# Patient Record
Sex: Female | Born: 1935 | Race: White | Hispanic: No | Marital: Married
Health system: Southern US, Community
[De-identification: ages and names within clinical notes are randomized; demographics above are authoritative.]

---

## 2006-11-19 ENCOUNTER — Ambulatory Visit: Payer: Self-pay | Admitting: Internal Medicine

## 2009-01-09 ENCOUNTER — Ambulatory Visit: Payer: Self-pay | Admitting: Internal Medicine

## 2010-08-10 ENCOUNTER — Ambulatory Visit: Payer: Self-pay | Admitting: Internal Medicine

## 2010-08-28 ENCOUNTER — Ambulatory Visit: Payer: Self-pay | Admitting: Gastroenterology

## 2010-09-05 ENCOUNTER — Ambulatory Visit: Payer: Self-pay | Admitting: Internal Medicine

## 2012-05-18 ENCOUNTER — Inpatient Hospital Stay: Payer: Self-pay | Admitting: Internal Medicine

## 2012-05-18 LAB — BASIC METABOLIC PANEL
Calcium, Total: 11.1 mg/dL — ABNORMAL HIGH (ref 8.5–10.1)
Co2: 43 mmol/L (ref 21–32)
EGFR (African American): 55 — ABNORMAL LOW
Potassium: 3.6 mmol/L (ref 3.5–5.1)
Sodium: 142 mmol/L (ref 136–145)

## 2012-05-18 LAB — COMPREHENSIVE METABOLIC PANEL
Alkaline Phosphatase: 176 U/L — ABNORMAL HIGH (ref 50–136)
Anion Gap: 1 — ABNORMAL LOW (ref 7–16)
BUN: 23 mg/dL — ABNORMAL HIGH (ref 7–18)
Bilirubin,Total: 0.7 mg/dL (ref 0.2–1.0)
Calcium, Total: 10.3 mg/dL — ABNORMAL HIGH (ref 8.5–10.1)
Chloride: 100 mmol/L (ref 98–107)
Co2: 39 mmol/L — ABNORMAL HIGH (ref 21–32)
Creatinine: 0.91 mg/dL (ref 0.60–1.30)
EGFR (Non-African Amer.): >60
Osmolality: 288 (ref 275–301)
SGOT(AST): 101 U/L — ABNORMAL HIGH (ref 15–37)
SGPT (ALT): 59 U/L (ref 12–78)
Sodium: 140 mmol/L (ref 136–145)
Total Protein: 6.9 g/dL (ref 6.4–8.2)

## 2012-05-18 LAB — POTASSIUM: Potassium: 6.1 mmol/L — ABNORMAL HIGH (ref 3.5–5.1)

## 2012-05-18 LAB — MAGNESIUM: Magnesium: 2.1 mg/dL

## 2012-05-18 LAB — CK TOTAL AND CKMB (NOT AT ARMC)
CK, Total: 112 U/L (ref 21–215)
CK, Total: 160 U/L (ref 21–215)
CK-MB: 4.7 ng/mL — ABNORMAL HIGH (ref 0.5–3.6)

## 2012-05-18 LAB — CBC
MCH: 30.2 pg (ref 26.0–34.0)
MCV: 94 fL (ref 80–100)
Platelet: 104 10*3/uL — ABNORMAL LOW (ref 150–440)
RBC: 4.68 10*6/uL (ref 3.80–5.20)

## 2012-05-18 LAB — DIGOXIN LEVEL: Digoxin: 1.3 ng/mL

## 2012-05-18 LAB — PRO B NATRIURETIC PEPTIDE: B-Type Natriuretic Peptide: 15404 pg/mL — ABNORMAL HIGH (ref 0–450)

## 2012-05-18 LAB — TROPONIN I: Troponin-I: 0.12 ng/mL — ABNORMAL HIGH

## 2012-05-19 LAB — COMPREHENSIVE METABOLIC PANEL
BUN: 26 mg/dL — ABNORMAL HIGH (ref 7–18)
Calcium, Total: 10 mg/dL (ref 8.5–10.1)
Chloride: 92 mmol/L — ABNORMAL LOW (ref 98–107)
EGFR (African American): 59 — ABNORMAL LOW
Glucose: 136 mg/dL — ABNORMAL HIGH (ref 65–99)
SGOT(AST): 40 U/L — ABNORMAL HIGH (ref 15–37)
SGPT (ALT): 37 U/L (ref 12–78)
Sodium: 142 mmol/L (ref 136–145)
Total Protein: 6.4 g/dL (ref 6.4–8.2)

## 2012-05-19 LAB — CBC WITH DIFFERENTIAL/PLATELET
Basophil #: 0 10*3/uL (ref 0.0–0.1)
Basophil %: 0.1 %
Eosinophil %: 0 %
HCT: 40.4 % (ref 35.0–47.0)
HGB: 12.9 g/dL (ref 12.0–16.0)
Lymphocyte #: 0.6 10*3/uL — ABNORMAL LOW (ref 1.0–3.6)
MCH: 29.5 pg (ref 26.0–34.0)
MCV: 92 fL (ref 80–100)
Neutrophil #: 8.1 10*3/uL — ABNORMAL HIGH (ref 1.4–6.5)
RBC: 4.39 10*6/uL (ref 3.80–5.20)
WBC: 8.8 10*3/uL (ref 3.6–11.0)

## 2012-05-19 LAB — MAGNESIUM: Magnesium: 1.9 mg/dL

## 2012-05-19 LAB — LIPID PANEL
Cholesterol: 136 mg/dL (ref 0–200)
Ldl Cholesterol, Calc: 59 mg/dL (ref 0–100)
Triglycerides: 81 mg/dL (ref 0–200)

## 2012-05-20 LAB — BASIC METABOLIC PANEL
Anion Gap: 9 (ref 7–16)
Anion Gap: 5 — ABNORMAL LOW (ref 7–16)
Calcium, Total: 9.9 mg/dL (ref 8.5–10.1)
Calcium, Total: 9.6 mg/dL (ref 8.5–10.1)
Co2: 39 mmol/L — ABNORMAL HIGH (ref 21–32)
Creatinine: 1.07 mg/dL (ref 0.60–1.30)
EGFR (African American): 59 — ABNORMAL LOW
EGFR (African American): >60
EGFR (Non-African Amer.): 51 — ABNORMAL LOW
EGFR (Non-African Amer.): 59 — ABNORMAL LOW
Glucose: 162 mg/dL — ABNORMAL HIGH (ref 65–99)
Osmolality: 292 (ref 275–301)
Osmolality: 285 (ref 275–301)
Sodium: 143 mmol/L (ref 136–145)

## 2012-05-21 LAB — BASIC METABOLIC PANEL WITH GFR
Anion Gap: 3 — ABNORMAL LOW
BUN: 23 mg/dL — ABNORMAL HIGH
Calcium, Total: 9.2 mg/dL
Chloride: 98 mmol/L
Co2: 42 mmol/L
Creatinine: 0.72 mg/dL
EGFR (African American): >60
EGFR (Non-African Amer.): >60
Glucose: 103 mg/dL — ABNORMAL HIGH
Osmolality: 289
Potassium: 3.8 mmol/L
Sodium: 143 mmol/L

## 2012-05-31 ENCOUNTER — Inpatient Hospital Stay: Payer: Self-pay | Admitting: Internal Medicine

## 2012-05-31 LAB — URINALYSIS, COMPLETE
Bilirubin,UR: NEGATIVE
Glucose,UR: NEGATIVE mg/dL (ref 0–75)
Hyaline Cast: 1
Ketone: NEGATIVE
Ph: 6 (ref 4.5–8.0)
RBC,UR: 2 /HPF (ref 0–5)
Specific Gravity: 1.012 (ref 1.003–1.030)
WBC UR: 4 /HPF (ref 0–5)

## 2012-05-31 LAB — CBC
HCT: 37.1 % (ref 35.0–47.0)
HGB: 12.3 g/dL (ref 12.0–16.0)
MCHC: 33 g/dL (ref 32.0–36.0)
Platelet: 234 10*3/uL (ref 150–440)
RDW: 14.1 % (ref 11.5–14.5)
WBC: 26.7 10*3/uL — ABNORMAL HIGH (ref 3.6–11.0)

## 2012-05-31 LAB — DIFFERENTIAL
Basophil #: 0.2 10*3/uL — ABNORMAL HIGH (ref 0.0–0.1)
Lymphocyte %: 5.3 %
Monocyte #: 0.7 x10 3/mm (ref 0.2–0.9)
Monocyte %: 2.6 %
Neutrophil #: 24.4 10*3/uL — ABNORMAL HIGH (ref 1.4–6.5)
Neutrophil %: 91.4 %

## 2012-05-31 LAB — BASIC METABOLIC PANEL
Anion Gap: 7 (ref 7–16)
BUN: 29 mg/dL — ABNORMAL HIGH (ref 7–18)
Chloride: 95 mmol/L — ABNORMAL LOW (ref 98–107)
Co2: 34 mmol/L — ABNORMAL HIGH (ref 21–32)
Creatinine: 0.9 mg/dL (ref 0.60–1.30)
Glucose: 124 mg/dL — ABNORMAL HIGH (ref 65–99)
Osmolality: 279 (ref 275–301)
Potassium: 4.8 mmol/L (ref 3.5–5.1)

## 2012-05-31 LAB — MAGNESIUM: Magnesium: 2.1 mg/dL

## 2012-05-31 LAB — HEPATIC FUNCTION PANEL A (ARMC)
Albumin: 3 g/dL — ABNORMAL LOW (ref 3.4–5.0)
Bilirubin, Direct: 0.1 mg/dL (ref 0.00–0.20)
Bilirubin,Total: 0.8 mg/dL (ref 0.2–1.0)
SGOT(AST): 36 U/L (ref 15–37)
SGPT (ALT): 30 U/L (ref 12–78)

## 2012-05-31 LAB — CK TOTAL AND CKMB (NOT AT ARMC)
CK, Total: 53 U/L (ref 21–215)
CK-MB: 4.9 ng/mL — ABNORMAL HIGH (ref 0.5–3.6)

## 2012-05-31 LAB — TROPONIN I
Troponin-I: 0.13 ng/mL — ABNORMAL HIGH
Troponin-I: 0.14 ng/mL — ABNORMAL HIGH

## 2012-05-31 LAB — LIPASE, BLOOD: Lipase: 158 U/L (ref 73–393)

## 2012-06-01 LAB — COMPREHENSIVE METABOLIC PANEL
Albumin: 2.3 g/dL — ABNORMAL LOW (ref 3.4–5.0)
Alkaline Phosphatase: 63 U/L (ref 50–136)
Anion Gap: 5 — ABNORMAL LOW (ref 7–16)
BUN: 21 mg/dL — ABNORMAL HIGH (ref 7–18)
Calcium, Total: 8.2 mg/dL — ABNORMAL LOW (ref 8.5–10.1)
Chloride: 99 mmol/L (ref 98–107)
Co2: 34 mmol/L — ABNORMAL HIGH (ref 21–32)
EGFR (African American): >60
EGFR (Non-African Amer.): >60
Osmolality: 278 (ref 275–301)
Potassium: 4.1 mmol/L (ref 3.5–5.1)
SGOT(AST): 23 U/L (ref 15–37)
SGPT (ALT): 21 U/L (ref 12–78)
Sodium: 138 mmol/L (ref 136–145)
Total Protein: 4.5 g/dL — ABNORMAL LOW (ref 6.4–8.2)

## 2012-06-01 LAB — CBC WITH DIFFERENTIAL/PLATELET
Basophil %: 0.2 %
Eosinophil %: 0.4 %
HCT: 27.5 % — ABNORMAL LOW (ref 35.0–47.0)
HGB: 9.2 g/dL — ABNORMAL LOW (ref 12.0–16.0)
Lymphocyte #: 1.7 10*3/uL (ref 1.0–3.6)
Lymphocyte %: 9 %
MCV: 91 fL (ref 80–100)
Monocyte %: 3.6 %
Neutrophil #: 15.9 10*3/uL — ABNORMAL HIGH (ref 1.4–6.5)
RBC: 3.03 10*6/uL — ABNORMAL LOW (ref 3.80–5.20)
WBC: 18.3 10*3/uL — ABNORMAL HIGH (ref 3.6–11.0)

## 2012-06-01 LAB — DIGOXIN LEVEL: Digoxin: 1.77 ng/mL

## 2012-06-01 LAB — URINE CULTURE

## 2012-06-01 LAB — CK TOTAL AND CKMB (NOT AT ARMC)
CK, Total: 41 U/L (ref 21–215)
CK-MB: 5.6 ng/mL — ABNORMAL HIGH (ref 0.5–3.6)

## 2012-06-01 LAB — TROPONIN I: Troponin-I: 0.16 ng/mL — ABNORMAL HIGH

## 2012-06-02 LAB — CBC WITH DIFFERENTIAL/PLATELET
Basophil #: 0 10*3/uL (ref 0.0–0.1)
Basophil #: 0 10*3/uL (ref 0.0–0.1)
Basophil %: 0.1 %
Basophil %: 0.2 %
Eosinophil #: 0 10*3/uL (ref 0.0–0.7)
HGB: 7.9 g/dL — ABNORMAL LOW (ref 12.0–16.0)
HGB: 8.6 g/dL — ABNORMAL LOW (ref 12.0–16.0)
Lymphocyte %: 6.9 %
Lymphocyte %: 5.2 %
MCH: 30.2 pg (ref 26.0–34.0)
MCH: 30.3 pg (ref 26.0–34.0)
MCHC: 33.3 g/dL (ref 32.0–36.0)
MCHC: 33.4 g/dL (ref 32.0–36.0)
MCV: 91 fL (ref 80–100)
Monocyte #: 0.6 x10 3/mm (ref 0.2–0.9)
Monocyte #: 0.6 x10 3/mm (ref 0.2–0.9)
Monocyte %: 3.4 %
Neutrophil #: 15.5 10*3/uL — ABNORMAL HIGH (ref 1.4–6.5)
Neutrophil %: 90.9 %
Platelet: 137 10*3/uL — ABNORMAL LOW (ref 150–440)
RBC: 2.62 10*6/uL — ABNORMAL LOW (ref 3.80–5.20)
RDW: 14.1 % (ref 11.5–14.5)
RDW: 13.6 % (ref 11.5–14.5)

## 2012-06-05 LAB — CBC WITH DIFFERENTIAL/PLATELET
Basophil #: 0 10*3/uL (ref 0.0–0.1)
Basophil %: 0.4 %
Eosinophil %: 1.3 %
HCT: 23 % — ABNORMAL LOW (ref 35.0–47.0)
HGB: 7.8 g/dL — ABNORMAL LOW (ref 12.0–16.0)
Lymphocyte %: 14 %
MCHC: 34 g/dL (ref 32.0–36.0)
MCV: 91 fL (ref 80–100)
Monocyte %: 5 %
Neutrophil #: 5.4 10*3/uL (ref 1.4–6.5)
RBC: 2.54 10*6/uL — ABNORMAL LOW (ref 3.80–5.20)
RDW: 14.1 % (ref 11.5–14.5)
WBC: 6.8 10*3/uL (ref 3.6–11.0)

## 2012-12-08 DEATH — deceased

## 2014-05-20 IMAGING — CR DG CHEST 1V PORT
1 series · 1 of 1 positions shown · non-contrast
Comparison: none

REASON FOR EXAM: SOB
COMMENTS:

[ap]
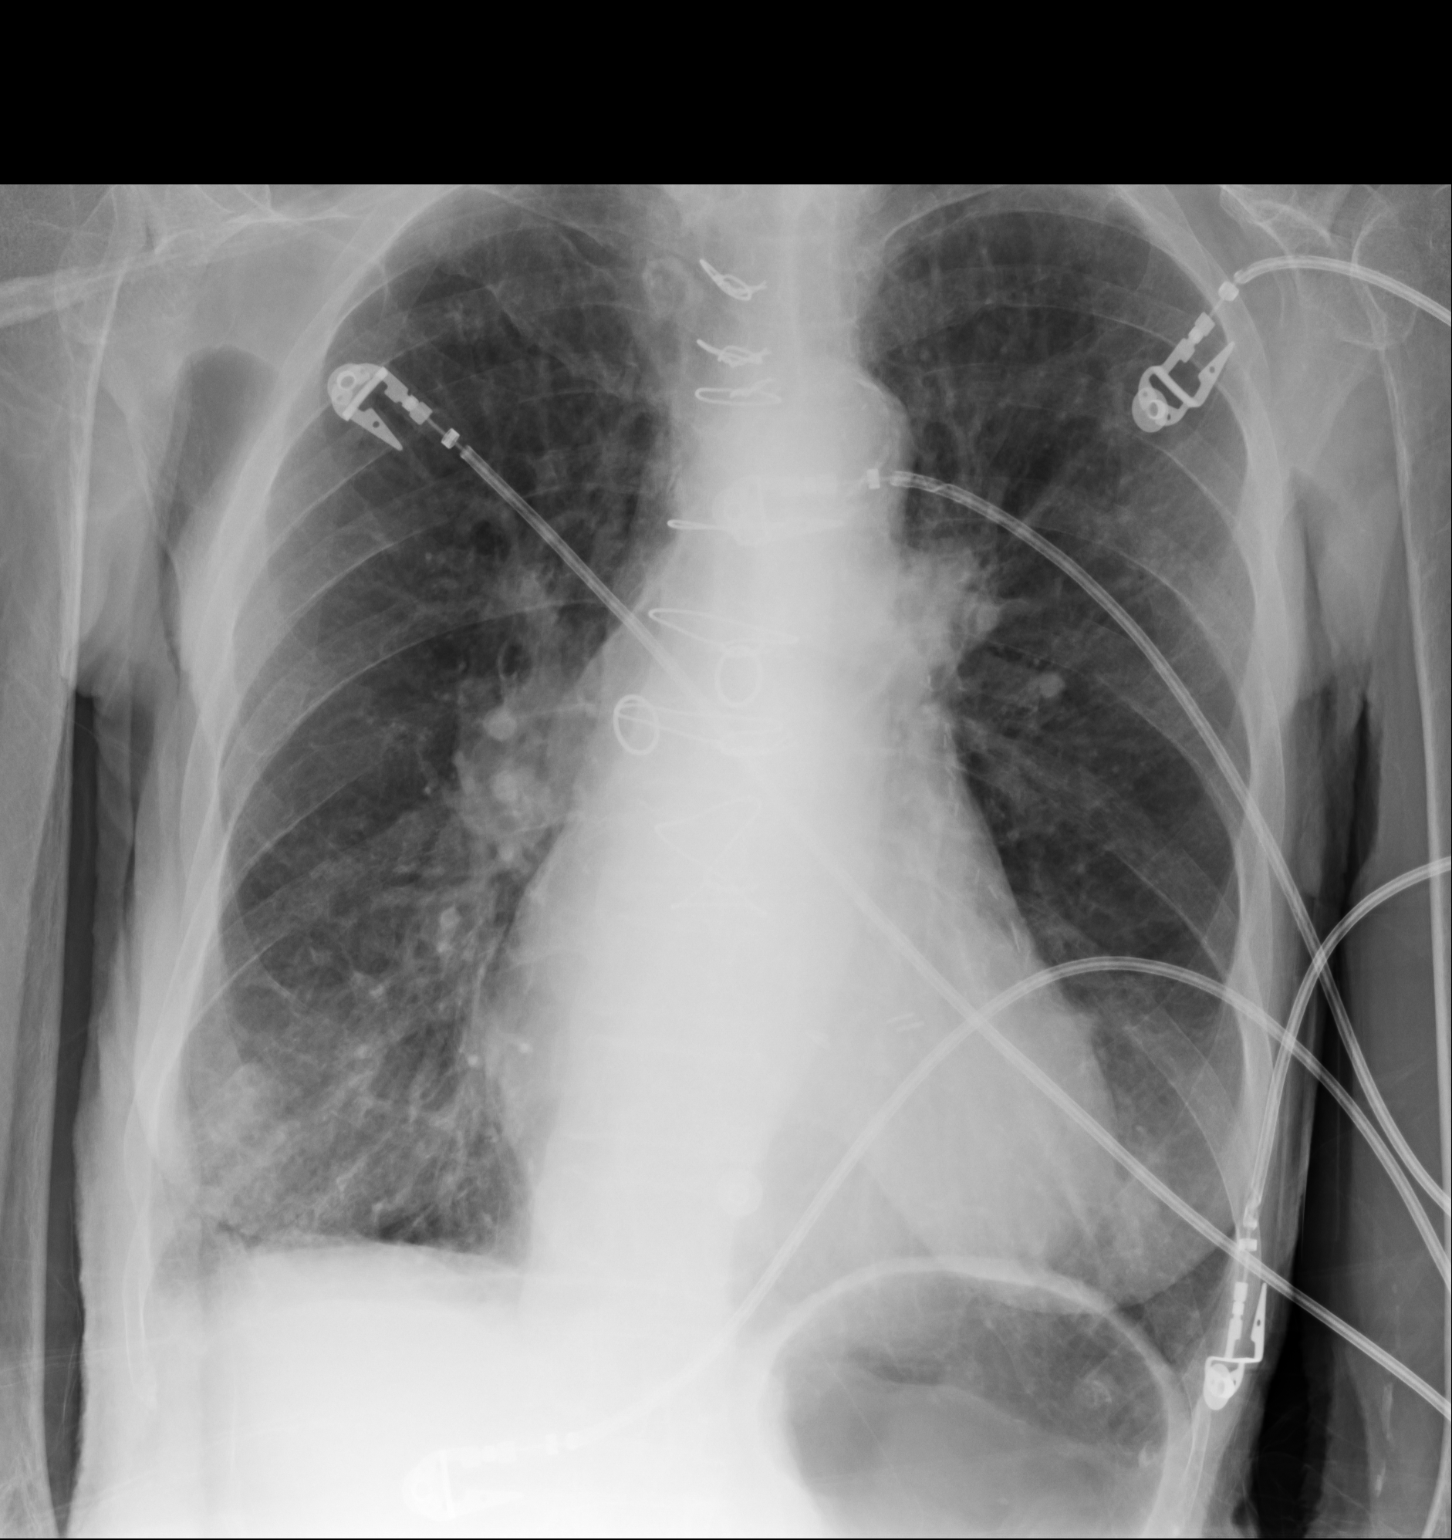

[1 of 1 positions shown; findings below may reference images not displayed]

PROCEDURE:     DXR - DXR PORTABLE CHEST SINGLE VIEW  - May 18, 2012  [DATE]

RESULT:     Comparison is made to the study August 10, 2010.

The lungs remain hyperinflated. There are coarse increased lung markings at
the right lung base which are new. Elsewhere the interstitial markings are
slightly increased. The cardiac silhouette is top normal in size. There is
mild prominence of the central pulmonary vascularity. The patient has
undergone previous CABG.
IMPRESSION: 1. The findings are consistent with COPD with superimposed atelectasis or
developing infiltrate in the right lower lobe.
2. Mildly increased interstitial markings are present diffusely which may
reflect an element of low-grade interstitial edema of cardiac or noncardiac
cause. A followup PA and lateral chest x-ray would be of value.

[REDACTED]

## 2014-06-03 IMAGING — CR DG CHEST 2V
1 series · 3 of 3 positions shown · non-contrast
Comparison: none

REASON FOR EXAM: COPD
COMMENTS:

PROCEDURE:     DXR - DXR CHEST PA (OR AP) AND LATERAL  - June 01, 2012  [DATE]
RESULT:     Comparison: 05/31/2012, 05/21/2012

[Series 3: x chest ap · 0.14mm/px · 3 of 3 slices shown]
[im 1/3]
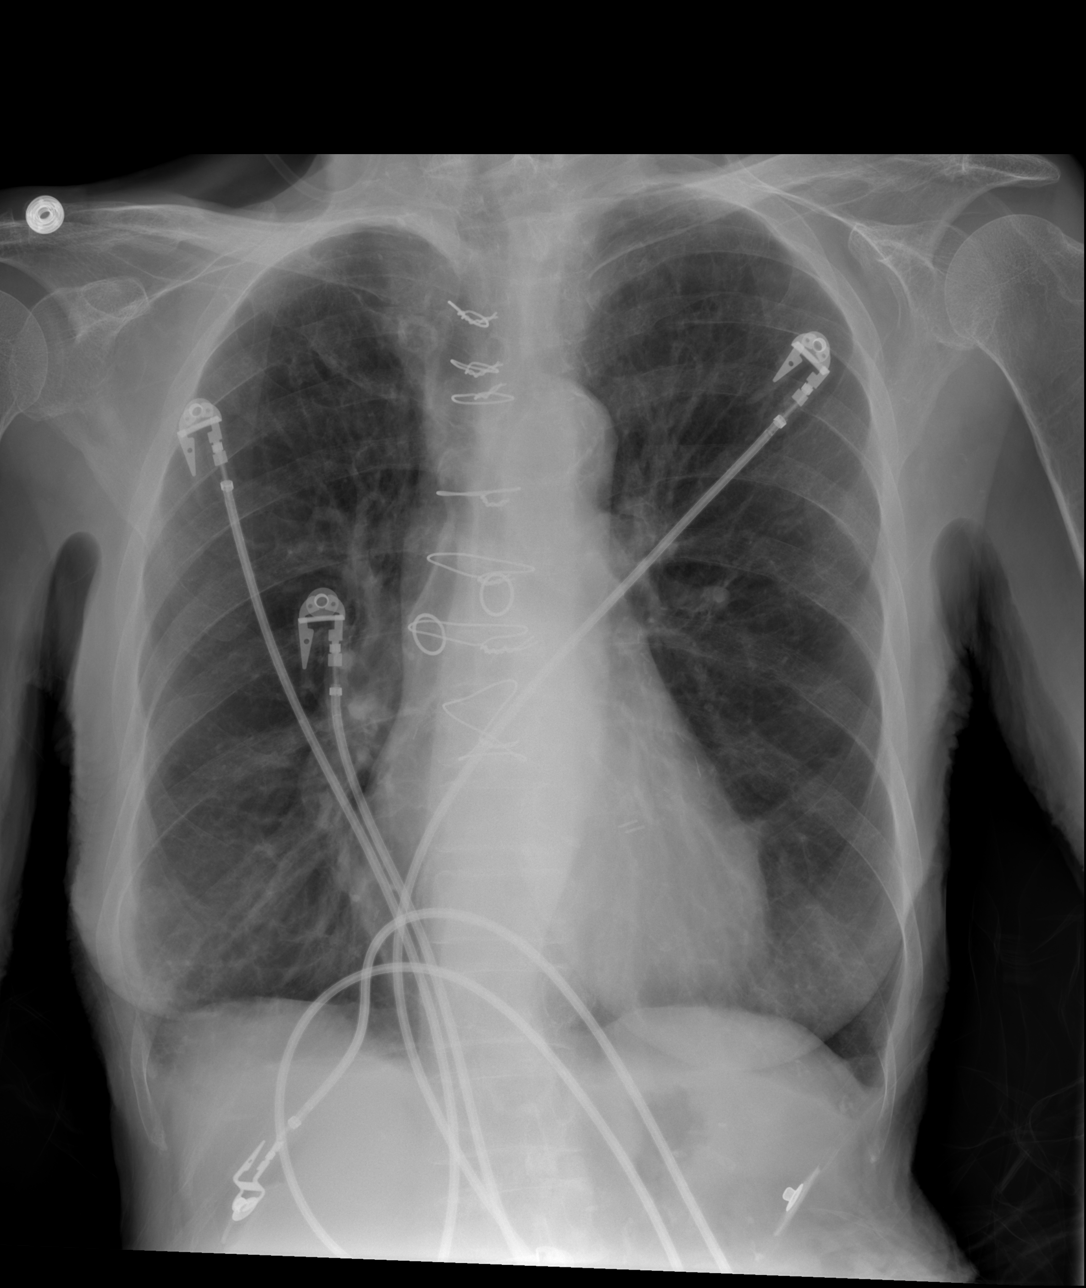
[im 2/3]
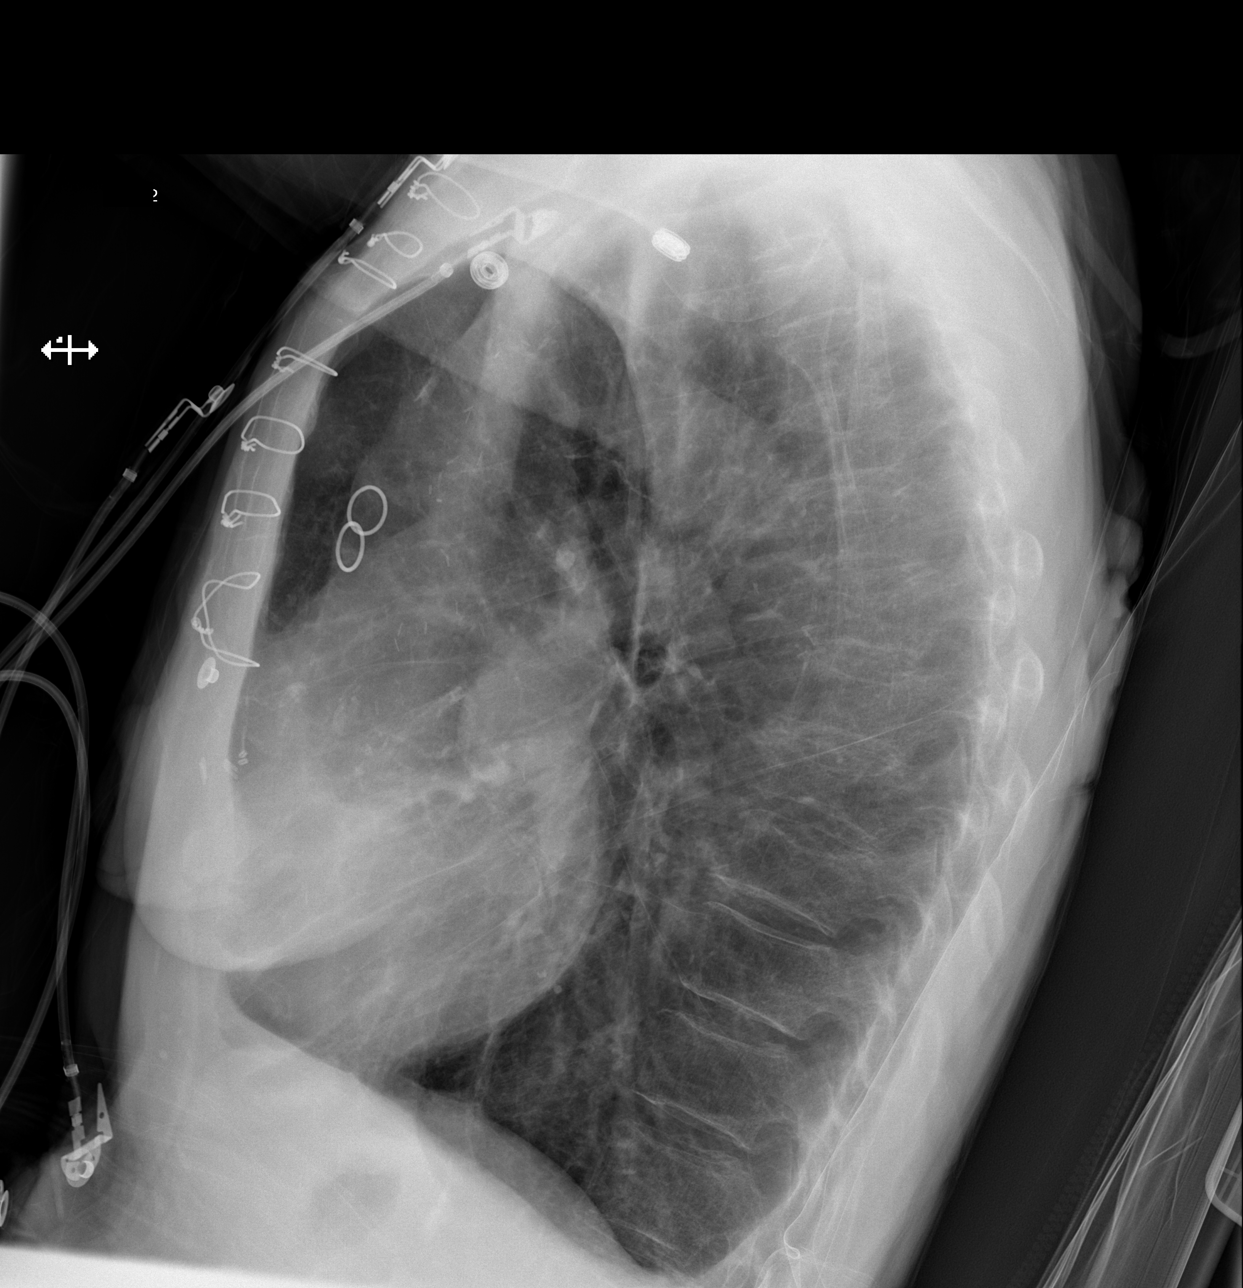
[im 3/3]
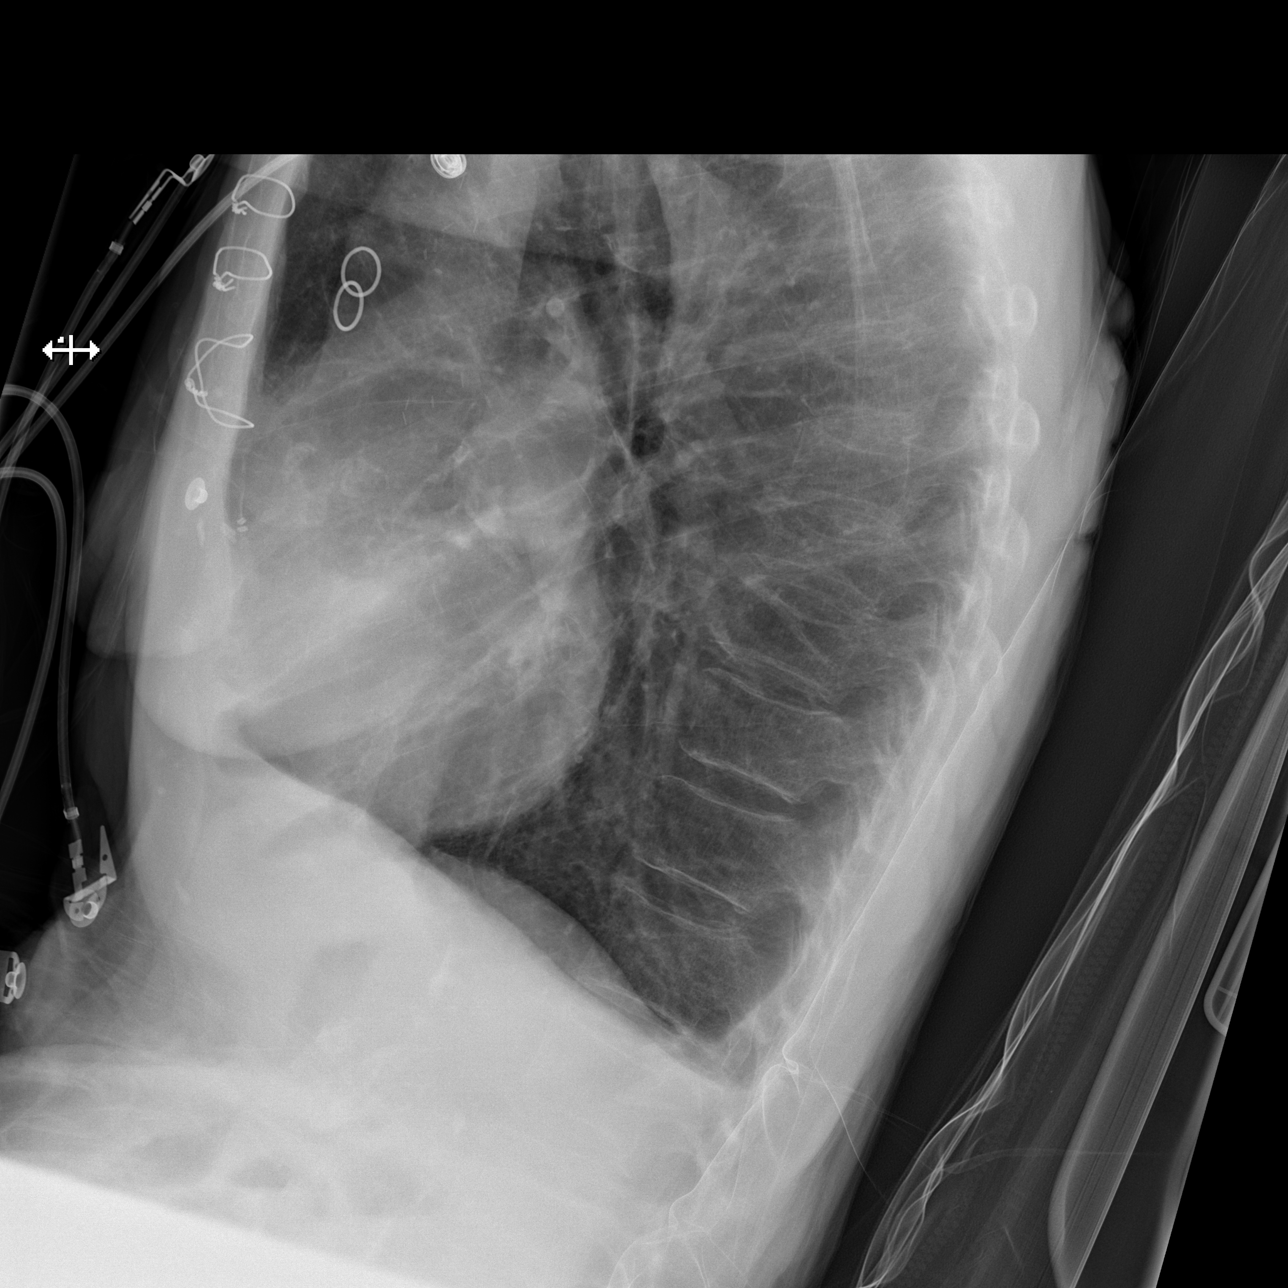

[3 of 3 positions shown; findings below may reference images not displayed]

FINDINGS: PA and lateral chest radiographs are provided. The lungs are hyperinflated
likely secondary to COPD. There is no focal parenchymal opacity, pleural
effusion, or pneumothorax. There is evidence of prior CABG..  The osseous
structures are unremarkable.
IMPRESSION: No acute disease of the che[REDACTED]

## 2014-12-27 NOTE — Discharge Summary (Signed)
PATIENT NAME:  Dawn Mcclain, Dawn Mcclain MR#:  [id number] DATE OF BIRTH:  June 27, 1935  DATE OF ADMISSION:  05/31/2012 DATE OF DISCHARGE:  06/05/2012  DISCHARGE DIAGNOSES:  1. Nausea and vomiting secondary to digoxin toxicity.  2. Viral gastroenteritis, resolved.  3. Melena with Pradaxa use, possible GI bleed, resolved. The patient wants EGD as an outpatient.  4. Anemia, acute on chronic with GI loss, stable.  5. Chronic atrial fibrillation.  6. Chronic obstructive pulmonary disease.   DISCHARGE MEDICATIONS:  1. Temazepam 30 mg at bedtime. 2. Digoxin 125 mcg p.o. daily.  3. Atorvastatin 20 mg daily. 4. Nicotine patch daily.  5. Spiriva 1 capsule inhalation daily.  6. Fluticasone-salmeterol 250/50 one puff b.i.d.  7. Metoprolol 25 mg daily.   DO NOT TAKE:  1. Aspirin. 2. Pradaxa.  3. Cardizem.    HOME OXYGEN: 2 liters   REFERRAL: Home health services    CONSULTATIONS:  1. Cardiology consult with Dr. Lady Gary  2. GI consult with Dr. Bluford Kaufmann  3. Physical therapy   LABORATORY, DIAGNOSTIC, AND RADIOLOGICAL DATA: EKG sinus rhythm with first-degree AV block with fusion complexes on admission.  Lipase 158. Digoxin level 2.2 on admission. Electrolytes sodium 136, potassium 4.8, chloride 95, bicarb 34, BUN 29, creatinine 0.9, glucose 124. Troponin slightly up at 0.13. WBC 26.7, hemoglobin 12.3, hematocrit 37.1, platelets 234. Urine cultures mixed organisms.   Chest x-ray emphysematous with some right lung atelectasis or fibrosis.   Stool for C. difficile negative. Blood cultures no growth. Dig levels are down to 1.77 on the 23rd. Troponin maximum 0.16. White count gradually came down and it is normal this morning. Hemoglobin 7.8 today and hematocrit 23. Hemoglobin on admission 12.3 and hematocrit 37.1.   HOSPITAL COURSE: The patient is a 80 year old female with chronic atrial fibrillation on Cardizem and digoxin who had recent hospital stay and her medications were changed to Cardizem from beta-blocker  and she is on digoxin. Since starting those medications she felt nauseous with very loose stools and decreased p.o. intake and came in with nausea, vomiting, and loose stools and dig toxicity with level of 2.2. The patient's digoxin was stopped and she was admitted to the hospitalist service and started on IV fluids. The patient told me that Cardizem also did not help her so we did not start the Cardizem and continued the fluids and held all the previous medicines because she also was hypotensive. The patient had some gastroenteritis symptoms with elevated white count. She was getting antibiotics in the hospital. She finished six days and white count normalized. Her nausea and vomiting resolved. We started digoxin and Cardizem as well. Initially we couldn't give any medications for the blood pressure and heart rate because she was hypotensive but yesterday she started to have slight tachycardia and she was started back on digoxin per the cardiologist. Once her dig levels are normal but metoprolol was started yesterday for atrial fibrillation and she tolerated it very well. The patient was seen by Dr. Lady Gary for positive troponins. He did not recommend any further intervention. The patient has chronic obstructive pulmonary disease. Echocardiogram during last discharge showed LV function. Digoxin dose was reduced and Cardizem was stopped and restarted the beta-blockers. The patient continued Pradaxa but the patient developed some melena with black stools on the 25th. Pradaxa was promptly discontinued and continued on fluids, continued on PPIs. The patient's melena disappeared. The patient's hemoglobin did drop but this morning she did not have any melena. Refused blood transfusion. Seen by Dr. Bluford Kaufmann  who recommended GI work-up as an outpatient. She already received Protonix while in the hospital. The patient was advised not to take Pradaxa or aspirin in light of black stool. Still she sees Dr. Bluford Kaufmannh. For chronic atrial  fibrillation, she was continued on Digoxin. Rate well controlled. Continue metoprolol. She had no COPD flares during the hospital stay. She can continue Advair and Spiriva. She was advised to quit smoking. The patient is still smoking about 2 packs a week and advised to quit. For her deconditioning, she was seen by physical therapist and they did not have any recommendations. She is sent home with her son.   CODE STATUS: DO NOT RESUSCITATE.   TIME SPENT ON DISCHARGE PREPARATION: More than 30 minutes.   ____________________________ Katha HammingSnehalatha Ortha Metts, MD sk:drc D: 06/05/2012 22:36:00 ET T: 06/06/2012 14:53:23 ET JOB#: [id number]  cc: Katha HammingSnehalatha Iysis Germain, MD, <Dictator> Katha HammingSNEHALATHA Elwanda Moger MD ELECTRONICALLY SIGNED 06/11/2012 17:01

## 2014-12-27 NOTE — Consult Note (Signed)
    General Aspect patient is a 80 year old female with history of coronary artery disease status [facility]. She has oxygen-dependent COPD and was recently admitted iwth progressive shortness of breatha nd hypoxia. She resumed her oxygen therapy and improed somewhat. She was noted ot have afib with rvr and rate was improved iwht digoxin and cardizem.  Echocardiogram  revealed preserved left ventricular function.  She is moderate pulmonary hypertension but no high-grade valvular disease.  She is now admitted iwth prgerssive nausea an dvomiting. Her afib persists. She feels that the nausea and vomiting is socondary to the digoxin and cardizem. She had mod elevated digoxin level on admission. Her nause has improved after stoppin gthe cardizem and dogixn.   Physical Exam:   GEN thin    HEENT PERRL, hearing intact to voice    NECK supple    RESP postive use of accessory muscles  rhonchi    CARD Irregular rate and rhythm  No murmur    ABD denies tenderness  normal BS    LYMPH negative neck, negative axillae    EXTR negative cyanosis/clubbing, negative edema    SKIN normal to palpation    NEURO cranial nerves intact, motor/sensory function intact    PSYCH A+O to time, place, person, poor insight   Review of Systems:   Subjective/Chief Complaint nausea and vomiting    General: Fatigue  Weakness    Skin: No Complaints    ENT: No Complaints    Eyes: No Complaints    Neck: No Complaints    Respiratory: Frequent cough  Short of breath    Cardiovascular: Palpitations    Gastrointestinal: Nausea    Genitourinary: No Complaints    Vascular: No Complaints    Musculoskeletal: No Complaints    Neurologic: No Complaints    Hematologic: No Complaints    Endocrine: No Complaints    Psychiatric: No Complaints    Review of Systems: All other systems were reviewed and found to be negative     Medications/Allergies Reviewed Medications/Allergies reviewed   EKG:   Interpretation atrial fibrillation    No Known Allergies:     Impression 80 yo female with history of cad s/p cabg, oxygen dependent copd with continued tobacco abuse with afib treated with rate control after recent admission who now is admitted iwth progressive nausea and vomiting. SAhe feels this is secondary to herrecent treatement with cardizem and digoxin after recent admission. She had mild digoxin toxicithy on admission which may have been secondary to the elevated levels vs volume depletion due to the nause. She also was placed on cardizem which was a new meds for her. The digoxin has been reduced and cardizem stopped iwth improvement in her symtpoms.    Plan 1. Discontinue cardizem and place on beta blockers for rate control 2. Reduce digoxin and follow symtpoms. 3. Follow rate and symtpoms 4. Cahds score is 2. Will rediscuss anticoagulation as outpatient. ASA for now.   Electronic Signatures: Dalia HeadingFath, Lancer Thurner A (MD)  (Signed 23-Sep-13 21:51)  Authored: General Aspect/Present Illness, History and Physical Exam, Review of System, EKG , Allergies, Impression/Plan   Last Updated: 23-Sep-13 21:51 by Dalia HeadingFath, Curvin Hunger A (MD)

## 2014-12-27 NOTE — Consult Note (Signed)
PATIENT NAME:  Dawn Mcclain, Dawn Mcclain MR#:  [id number] DATE OF BIRTH:  06-26-1935  DATE OF CONSULTATION:  06/04/2012  REFERRING PHYSICIAN:   CONSULTING PHYSICIAN:  Ezzard Standing. Bluford Kaufmann, MD  REASON FOR REFERRAL: Melena.   HISTORY OF PRESENT ILLNESS: The patient is a 80 year old white female who was recently hospitalized with COPD exacerbation and atrial fibrillation who was readmitted on the 22nd with increasing weakness, nausea, and anorexia. She also was found to have some melena and elevated digoxin level. She was also hypotensive and had a white count of 26,000. The patient was recently started on Pradaxa for the atrial fibrillation. The Pradaxa was stopped and digoxin was held. Blood pressure medication was held as well. Stool was sent for C. difficile. It came back negative.   I was asked to see the patient because of melena. I was asked to see whether she warrants an upper endoscopy.   Right now she feels weak. Her breathing is still somewhat shallow. She denies having any melena. There is no more nausea or vomiting. No abdominal pain. Diarrhea has stopped.   PAST MEDICAL HISTORY:  1. Chronic obstructive pulmonary disease. 2. Atrial fibrillation. 3. Chronic tobacco use.  4. Coronary artery disease.   PAST SURGICAL HISTORY:  1. Appendectomy. 2. Cholecystectomy.   MEDICATIONS AT HOME:  1. Digoxin. 2. Potassium. 3. Lasix. 4. Lipitor. 5. Vasotec. 6. Temazepam.  7. Pradaxa, which was held. 8. Baby aspirin. 9. Cardizem.   ALLERGIES: She has no known drug allergy.   SOCIAL HISTORY: She still smokes. She denies any alcohol use.   FAMILY HISTORY: Negative for coronary artery disease, hypertension, or diabetes.   REVIEW OF SYSTEMS: No fevers or chills. There is no blurry vision. There are no visual changes. There is no chest pains or palpitations right now. She is chronically short of breath but no coughing. Right now there is no nausea, vomiting, abdominal pain, diarrhea, hematochezia, or melena  at this time. The rest of the review of symptoms is negative.   PHYSICAL EXAMINATION:   GENERAL: The patient is a thin female chronically ill appearing but in no acute distress.   VITAL SIGNS: She is afebrile with temperature 98.7, pulse 89, respirations 28, blood pressure 159/61, pulse oximetry 99 on 2 liters.   HEENT: Normocephalic and atraumatic head. Pupils are equally reactive. Throat was clear.   NECK: Supple.   CARDIAC: Irregular rhythm.   LUNGS: Decreased breath sounds bilaterally.   ABDOMEN: Soft and nontender. No hepatomegaly. There are no palpable masses.   EXTREMITIES: No clubbing, cyanosis, or edema.   LABORATORY, DIAGNOSTIC, AND RADIOLOGICAL DATA: Most recent labs showed sodium 138, potassium 4.1, chloride 99, BUN 21, creatinine 0.78, glucose 87. Liver enzymes are normal. Troponin level is slightly up at 0.16. Digoxin is now down to 1.7, was 2.2 on admission. White count 15.8, hemoglobin 8.6. Urine and stool cultures as well as blood cultures were negative. C. difficile was negative. PA and lateral x-ray shows no active disease but has hyperinflation secondary to COPD.   ASSESSMENT AND PLAN: This is a patient who was on Pradaxa for chronic atrial fibrillation. I suspect the bleeding was related to that. Melena suggests upper GI bleeding. The patient denies having anymore melena. She is already on Protonix. I discussed with her about possibly doing an upper endoscopy even tomorrow. The patient does not want to have it done right now. Perhaps later when her breathing is better she could consider doing it as an outpatient but not at this point.  I recommend we continue to follow the hemoglobin and watch for any signs of bleeding. Continue with the Protonix twice a day for now. Stay off the Pradaxa.   At this point I will sign off. If there is any question, please do not hesitate to contact me.   Thank you for the referral.   ____________________________ Ezzard StandingPaul Y. Bluford Kaufmannh,  MD pyo:drc D: 06/04/2012 15:03:19 ET T: 06/04/2012 17:16:36 ET JOB#: [id number]  cc: Ezzard StandingPaul Y. Bluford Kaufmannh, MD, <Dictator>  Ezzard StandingPAUL Y Jameire Kouba MD ELECTRONICALLY SIGNED 06/07/2012 62:1320:04

## 2014-12-27 NOTE — Discharge Summary (Signed)
PATIENT NAME:  Dawn Mcclain, Dawn Mcclain MR#:  [id number] DATE OF BIRTH:  [birth year]  DATE OF ADMISSION:  05/18/2012 DATE OF DISCHARGE:  05/21/2012  ADMITTING DIAGNOSIS: Chronic obstructive pulmonary disease exacerbation.   DISCHARGE DIAGNOSES: 1. Hypoxia/acute on chronic respiratory failure due to oxygen tube malfunctioning. 2. Chronic obstructive pulmonary disease exacerbation. 3. Acute bronchitis.  4. Atrial fibrillation, paroxysmal, rapid ventricular response.  5. Elevated troponin likely demand ischemia.  6. Hyperkalemia.  7. Chronic respiratory failure, oxygen dependent on 3 liters of oxygen through nasal cannula at home.  8. Ongoing tobacco abuse.  9. Medical noncompliance.  10. History of coronary artery disease status post coronary artery bypass grafting, status post appendectomy, and status post cholecystectomy in remote past.   DISCHARGE CONDITION: Stable.   DISCHARGE MEDICATIONS: The patient is to resume her outpatient medications. 1. Digoxin 250 mcg p.o. daily.  2. Potassium chloride 20 mEq p.o. daily.  3. Atorvastatin 20 mg at bedtime.  4. Furosemide 40 mg p.o. daily.  5. Enalapril 20 mg p.o. daily.  6. Temazepam 30 mg p.o. at bedtime.  7. Prednisone 40 mg p.o. for two days and then taper x10 mg every two days until stopped.  8. Dabigatran 150 mg p.o. twice daily.  9. Albuterol-ipratropium nebulizers inhaled as needed.  10. Aspirin 81 mg p.o. daily.  11. Cardizem CD 120 mg p.o. daily.  12. Zithromax 250 mg daily for four more days.  NOTE: The patient is not to take Metoprolol.  OXYGEN: 3 liters of oxygen through nasal cannula.   DIET: 2 grams salt, low fat diet, regular consistency.   ACTIVITY LIMITATIONS: As tolerated.  DISCHARGE FOLLOWUP/INSTRUCTIONS: Follow-up appointment with Dr. Arlana Pouchate in two days after discharge. The patient was advised not to smoke anymore.   CONSULTANTS:  1. Harold HedgeKenneth Fath, MD. 2. Care Management.   RADIOLOGIC STUDIES: Echocardiogram on  05/19/2012 showed left ventricular systolic function low normal, ejection fraction 50 to 55%, mild tricuspid regurgitation, and right ventricle is mildly dilated. There is mild mitral regurgitation and mild valvular aortic stenosis was noted.  Chest x-ray, portable single view, on 05/18/2012, showed findings consistent with chronic obstructive pulmonary disease with superimposed atelectasis or developing infiltrate in the right lower lobe and mildly increased interstitial markings are present diffusely which may reflect an element of low-grade interstitial edema of cardiac or noncardiac cause.   Repeated chest x-ray, PA and lateral, on 05/21/2012, showed findings consistent with chronic obstructive pulmonary disease. No discrete pneumonia but there is a small amount of pleural fluid present bilaterally which has increased inconspicuously on the right.  HISTORY OF PRESENT ILLNESS: The patient is a 80 year old Caucasian female with history of chronic obstructive pulmonary disease oxygen dependent who presented to the hospital with complaints of shortness of breath. Please refer to Dr. Antionette PolesAbrol's admission on 05/18/2012. Apparently the patient's tubing got entangled in the bathroom and she was not receiving oxygen for a few hours so she was short of breath and she called EMS. She was treated in the Emergency Room with Solu-Medrol IV as well as DuoNebs but because she was tachypneic as well as tachycardic hospitalist services were contacted for admission.   On the day of admission, her temperature was 97.6, pulse 91, respiratory rate 24, blood pressure 127/50, and saturation was 90% on oxygen therapy. Her lab studies showed a markedly elevated B-type natriuretic peptide of 15,404. Glucose was 184, BUN was 23, potassium was 6.6 initially and the patient's bicarbonate level was elevated to 39. Calcium level was slightly elevated at  10.3. Liver enzymes showed elevated alkaline phosphatase of 176 and AST 101. The  patient's cardiac enzymes revealed mild elevation of troponin as well as MB fraction. Troponin level was 0.1 and MB fraction 5.1 on the first set, troponin 0.12 and CK/MB 4.7 respectively on the second set, and troponin 0.24 and CK-MB fraction 4.8 on the third set. Digoxin level was 1.3. CBC was within normal limits. However, the patient's platelet count was low at 104.   HOSPITAL COURSE: The patient was admitted to the hospital for further evaluation. She was started on steroids IV as well as antibiotic therapy with Rocephin and Zithromax and nebulizers. With this therapy she improved.   In regards to acute on chronic respiratory failure, it was felt that patient's acute on chronic respiratory failure likely is related to COPD exacerbation and acute bronchitis. The patient is to continue antibiotic therapy for four more days with Zithromax. No pneumonia was noted on repeated chest x-ray. It was felt that the patient's changes on lung exam were very likely atelectasis. The patient was given one dose of Lasix prior to discharge and it was felt the patient's fluid collection in the pleural space was very likely atrial fibrillation, RVR related.   In regards to atrial fibrillation/RVR, consultation with Dr. Lady Gary was obtained. Dr. Lady Gary felt that the patient needs to have Cardizem on board apart from digoxin Cardizem orally was initiated and it was changed to Cardizem CD. It is recommended to advance Cardizem dose as needed to ensure normal heart rate. The patient's heart rate on day of discharge is ranging from 60s to 90s. however, the patient remains in atrial fibrillation. Her vital signs are stable with temperature of 98, pulse as mentioned above 67 to 96, respiration rate 20 to 22, blood pressure ranging from 128 systolic to 136 and 50s to 60s diastolic, and oxygen saturation was 93% on 3 liters of oxygen through nasal cannula at rest.   In regards to acute bronchitis, unfortunately we were not able to  obtain any sputum cultures. The patient is to continue antibiotic therapy as well as steroid taper and inhalation therapy. She is to follow up with her primary care physician for further recommendations.   For hyperkalemia, the patient's potassium level was found to be high initially on arrival to the hospital, 6.6. The patient was given Kayexalate and potassium level decreased to 3.0 on 05/19/2012. The patient's potassium level was supplemented and on the day of discharge the patient's potassium level is 3.8. It is recommended to follow the patient's potassium levels as an outpatient to ensure stability.   For ongoing tobacco abuse, the patient was counseled and was recommended to quit.   The patient was evaluated by our social workers who recommended home health. The patient will be discharged with home health at home.   TIME SPENT: 40 minutes. ____________________________ Katharina Caper, MD rv:slb D: 05/21/2012 18:52:26 ET T: 05/22/2012 11:53:47 ET JOB#: [id number]  cc: Katharina Caper, MD, <Dictator> Jillene Bucks. Arlana Pouch, MD Katharina Caper MD ELECTRONICALLY SIGNED 05/31/2012 18:26

## 2014-12-27 NOTE — Consult Note (Signed)
Pt seen and examined. Full consult to follow. Admitted with weakness,dig toxicity, and melena, prob from pradexaxarelto use. Some drop in hgb. Has bad COPD. Feels weak but no more melena. No abd pain, diarrhea, or nausea. Discussed doing EGD. Pt refused to schedule EGD at this time. Perhaps later when she feels better or breathing stable, she will consider. For now, moniter hgb and stool for blood. Ok to continue daily PPI. Pt can f/u with us in office later. Will sign off. Thanks.   Electronic Signatures: Lutricia Feilh, Loletta Harper (MD) (Signed on 26-Sep-13 14:57)  Authored   Last Updated: 26-Sep-13 15:03 by Lutricia Feilh, Fara Worthy (MD)

## 2014-12-27 NOTE — H&P (Signed)
PATIENT NAME:  Dawn Mcclain, Dawn Mcclain MR#:  [id number] DATE OF BIRTH:  1935/01/31  DATE OF ADMISSION:  05/18/2012  PRIMARY CARE PHYSICIAN: Dr. Dewaine Oats   CHIEF COMPLAINT: Shortness of breath.   SUBJECTIVE: This is a 80 year old female who presents to the ER with a chief complaint of shortness of breath. The patient has a history of chronic obstructive pulmonary disease and is oxygen dependent on 3 liters at home. Last night the patient states that she could not breathe because her oxygen was cut off from the oxygen tank because of replacement of  the tubing.  She also has been having some diarrhea and tubing got entangled in the bathroom and she did not have oxygen for several hours. The patient panicked and called EMS and came in by 911. The patient states that she has not had any chest pain or any shortness of breath. She was treated with IV Solu-Medrol and DuoNebs because of her tachypnea and her respiratory distress and is being admitted for chronic obstructive pulmonary disease exacerbation.   PAST MEDICAL HISTORY:  1. Chronic obstructive pulmonary disease on home oxygen.  2. History of coronary artery disease, status post CABG.   PAST SURGICAL HISTORY:  1. Status post CABG 10 years ago.  2. Appendectomy.  3. Cholecystectomy.   SOCIAL HISTORY: The patient smokes 2 packs a week, has smoked for 60 years, denies use of alcohol. Lives alone. Son lives close by and is very supportive.   FAMILY HISTORY: Negative for coronary artery disease, hypertension, or diabetes.   HOME MEDICATIONS:  One of the medications the patient takes is digoxin. The son is by the bedside and he does not have the basic medication list for me to document.   ALLERGIES: No known drug allergies.   REVIEW OF SYSTEMS: CONSTITUTIONAL: No fever, fatigue, weakness, pain, weight loss or weight gain. EYES: No blurry vision, double vision, pain, redness, inflammation, glaucoma, or cataracts. ENT: No tinnitus, ear pain, hearing  loss, seasonal allergies, epistaxis, or discharge.   RESPIRATORY: No cough, wheezing, hemoptysis, dyspnea, or asthma. CARDIOVASCULAR: No chest pain, orthopnea, edema, arrhythmia, or dyspnea on exertion. GASTROINTESTINAL: No nausea, vomiting, abdominal pain, hematemesis, or melena. GENITOURINARY: No dysuria, hematuria, renal calculi, or frequency. RESPIRATORY: Positive for cough and wheezing but no hemoptysis. ENDOCRINE: No polyuria, nocturia, thyroid problems, increased sweating, or heat or cold intolerance. INTEGUMENT: No acne, rash, lesions, or change in mole, hair, or skin. MUSCULOSKELETAL: No pain in the neck, back, shoulder, knee, or hip. No arthritis, cramps, swelling, gout, redness, or limited activity. NEUROLOGIC: No numbness, weakness, dysarthria, epilepsy, tremor, vertigo, or ataxia. PSYCHIATRIC: No anxiety, insomnia, OCD, bipolar disorder, schizophrenia, or nervousness.   PHYSICAL EXAMINATION:  VITAL SIGNS: Temperature 97.6, respirations 24, pulse 91, blood pressure 127/50.   GENERAL: Currently comfortable, in no acute pulmonary distress during the time of this interview.   EYES: Anicteric sclerae, anicteric moist conjunctivae. No lid lag. Pupils equal and reactive.   HENT: Atraumatic. Oropharynx clear without any mucus membrane lesions or ulcerations.   NECK: Trachea in the midline. Range of motion of the neck is intact and supple. No thyromegaly. No lymphadenopathy.   LUNGS: Mild bibasilar wheezing at the bases with normal respiratory effort and no intercostal muscle retractions or accessory muscle use.   CARDIOVASCULAR: Regular rate and rhythm. No murmurs, rubs, or gallops.   ABDOMEN: Soft, nontender, nondistended, normoactive bowel sounds. No hepatosplenomegaly.   EXTREMITIES: No peripheral edema or extremity lymphadenopathy.   SKIN: Normal temperature. Normal turgor. Normal texture.  No rash, ulcerations, or subcutaneous nodules.   PSYCHIATRIC: Appropriate mood and affect.    LABORATORY, DIAGNOSTIC, AND RADIOLOGICAL DATA: Magnesium 2.1, digoxin 1.3, potassium 6.1. BNP is 15,000, troponin 0.1.   CK-MB is 5.1.   Glucose 184, BUN 23, creatinine 0.91, sodium 140, potassium 6.6, chloride 100, bicarbonate is 39, calcium 10.3, AST 101, ALT 59, WBC 10.8, hemoglobin 14.2, hematocrit 44, platelet count 104.   Chest x-ray shows mild pulmonary vascular congestion.   EKG shows sinus rhythm with first-degree AV block.   ASSESSMENT AND PLAN:  1. Shortness of breath likely secondary to chronic obstructive pulmonary disease exacerbation. The patient continues to smoke and is on home oxygen at 3 liters. The patient will be admitted for treatment of his chronic obstructive pulmonary disease exacerbation. She will continue to be treated with high-dose IV steroids and DuoNeb treatment. The patient will also be started on scheduled DuoNeb treatments until her symptoms resolve. The patient denies any chest pain per se. She has not had any orthopnea or paroxysmal nocturnal dyspnea or dependent edema and therefore I doubt that there is a component of congestive heart failure, but her BNP is elevated; therefore, we will obtain a 2-D echo.  2. Non-ST-elevation myocardial infarction. Likely secondary to demand ischemia in the setting of oxygen deprivation. We will continue to trend troponin, telemetry, 2-D echo, aspirin. Hold off on low dose beta blocker because of her chronic obstructive pulmonary disease exacerbation as well as her adequate rate control. Continue nitroglycerin. First troponin is mildly abnormal. Continue to monitor closely.  3. Hyperkalemia, treated with Kayexalate.  Repeat in the morning to ensure that her potassium has improved.  4. Reconcile her medication list. This was not reconciled at the time of this admission.  5. She is a FULL CODE.      TIME SPENT: 70 minutes.   ____________________________ Richarda OverlieNayana Taiten Brawn, MD na:bjt D: 05/18/2012 05:32:14 ET T: 05/18/2012  07:22:28 ET JOB#: [id number]  cc: Richarda OverlieNayana Briahnna Harries, MD, <Dictator> Jillene Bucksenny C. Arlana Pouchate, MD Richarda OverlieNAYANA Eveny Anastas MD ELECTRONICALLY SIGNED 06/05/2012 2:18

## 2014-12-27 NOTE — Consult Note (Signed)
General Aspect patient is a 80 year old female with history of coronary artery disease status post coronary artery bypass grafting in 2001 at [facility] by Dr. Normand Sloop, who had been followed at [facility] by currently lives in the [place] area.  She has oxygen-dependent COPD.  He was admitted after being off of her oxygen apparently for several hours last night.  On arrival number his room she was somewhat hypoxic or chest x-ray did not reveal significant volume overload.  She was admitted and placed back on her oxygen.  She does admit to drinking a large amount of "vegetable juice" which likely had a high sodium content.  She has a mild serum troponin elevation of 0.24.  She denies any chest pain.  Initial EKG reveals sinus rhythm and nonspecific ST-T wave changes.  Since admission she has developed atrial fibrillation with rapid ventricular response.  Rare that this is occurring in this may be intermittently occurring as an outpatient.  Echocardiogram results revealed preserved left ventricular function.  She is moderate pulmonary hypertension but no high-grade valvular disease.  She currently is improved clinically.  She denied any chest pain.  She reports compliance with her medications.  she is on oxygen at 3 L per minute and continues to smoke cigarettes.   Physical Exam:   GEN thin    HEENT PERRL, hearing intact to voice    NECK supple    RESP postive use of accessory muscles  rhonchi    CARD Irregular rate and rhythm  Tachycardic  No murmur    ABD denies tenderness  normal BS    LYMPH negative neck, negative axillae    EXTR negative cyanosis/clubbing, negative edema    SKIN normal to palpation    NEURO cranial nerves intact, motor/sensory function intact    PSYCH A+O to time, place, person, poor insight   Review of Systems:   Subjective/Chief Complaint shortness of breath    General: Fatigue  Weakness    Skin: No Complaints    ENT: No  Complaints    Eyes: No Complaints    Neck: No Complaints    Respiratory: Frequent cough  Short of breath    Cardiovascular: Dyspnea    Gastrointestinal: No Complaints    Genitourinary: No Complaints    Vascular: No Complaints    Musculoskeletal: No Complaints    Neurologic: No Complaints    Hematologic: No Complaints    Endocrine: No Complaints    Psychiatric: No Complaints    Review of Systems: All other systems were reviewed and found to be negative    Medications/Allergies Reviewed Medications/Allergies reviewed   Home Medications: Medication Instructions Status  digoxin 250 mcg (0.25 mg) oral tablet 1 tab(s) orally once a day Active  potassium chloride 20 mEq oral tablet, extended release 1 tab(s) orally once a day Active  metoprolol tartrate 100 mg oral tablet 1 tab(s) orally once a day Active  atorvastatin 20 mg oral tablet 1 tab(s) orally once a day (at bedtime) Active  furosemide 40 mg oral tablet 1 tab(s) orally once a day Active  enalapril 20 mg oral tablet 1 tab(s) orally once a day Active  temazepam 30 mg oral capsule 1 cap(s) orally once a day (at bedtime) Active    No Known Allergies:     Impression 80 year old female with history of oxygen-dependent COPD who is admitted with progress her shortness of breath.  She apparently had been drinking a fairly high sodium beverage.  She states  she got somewhat confused and was off of her oxygen for several hours prior to presentation.  She was noted to be significantly hypoxic.  Chest x-ray did not reveal significant pulmonary edema.  She denies chest pain but complained of some shortness of breath and fatigue.  EKG on presentation reveals sinus rhythm.  She has subsequently developed atrial fibrillation with fairly rapid ventricular response.  She has a mild serum troponin elevation which does not appear to be secondary to an acute ischemic event but likely is secondary to increased demand do to her hypoxia and  possible intermittent atrial fibrillation as an outpatient.    Plan one.  Will continue with current meds although will take off of beta blockers and placed on Cardizem 30 mg q.6 to try to improve rate control.  Would continue with digoxin. 2.  Will follow her for recurrent atrial fibrillation.  Consideration for anticoagulation therapy chronically needs to be discussed.  Patient's chads score is 2. 3.  Also on low-sodium diet intake 4.  Careful diuresis 5.  Patient is not appear to be a candidate for invasive cardiac evaluation given current outcome.  Will follow heart rate and EKG as well as serum troponin to guide further therapy 6.  Further recommendations depending on her course   Electronic Signatures: Dalia HeadingFath, Monette Omara A (MD)  (Signed 10-Sep-13 14:45)  Authored: General Aspect/Present Illness, History and Physical Exam, Review of System, Home Medications, Allergies, Impression/Plan   Last Updated: 10-Sep-13 14:45 by Dalia HeadingFath, Ellawyn Wogan A (MD)

## 2014-12-27 NOTE — H&P (Signed)
PATIENT NAME:  Dawn Mcclain, Dawn Mcclain MR#:  [id number] DATE OF BIRTH:  [birth year]  DATE OF ADMISSION:  05/31/2012  REFERRING PHYSICIAN: Dr. Enedina Finner.   FAMILY PHYSICIAN: Dr. Arlana Pouch.   REASON FOR ADMISSION: Nausea with anorexia associated with loose stools, elevated digoxin level, and elevated troponin.   HISTORY OF PRESENT ILLNESS: The patient is a 80 year old female recently hospitalized with chronic obstructive pulmonary disease exacerbation and atrial fibrillation who was discharged approximately 10 days ago. She presents now with progressive weakness associated with nausea and anorexia. She has been having loose stools at least one time daily. In the Emergency Room, the patient was found to have an elevated digoxin level of 2.2 and was hypotensive. She was also found to have a white count of 26,000. She denies fevers. Denies chest pain. Respiratory status is stable. She is now admitted for further evaluation.   PAST MEDICAL HISTORY:  1. Chronic respiratory failure, on oxygen.  2. Chronic obstructive pulmonary disease.  3. Chronic atrial fibrillation.  4. Ongoing tobacco abuse.  5. Atherosclerotic cardiovascular disease status post coronary artery bypass graft.  6. Status post appendectomy.  7. Status post cholecystectomy.   MEDICATIONS:  1. Lanoxin 0.25 mg p.o. daily.  2. K-Dur 20 milliequivalents p.o. daily.  3. Lipitor 20 mg p.o. at bedtime.  4. Lasix 40 mg p.o. daily.  5. Vasotec 20 mg p.o. daily.  6. Temazepam 30 mg p.o. at bedtime.  7. Pradaxa 150 mg p.o. b.i.d.  8. DuoNeb SVNs every four hours as needed.  9. Aspirin 81 mg p.o. daily.  10. Cardizem CD 120 mg p.o. daily.   ALLERGIES: No known drug allergies.   SOCIAL HISTORY: The patient still smokes. No history of alcohol abuse. Lives alone.   FAMILY HISTORY: Negative for coronary artery disease, hypertension, or diabetes.   REVIEW OF SYSTEMS: CONSTITUTIONAL: No fever or change in weight. EYES: No blurred or double vision. No  glaucoma. ENT: No tinnitus or hearing loss. No nasal discharge or bleeding. No difficulty swallowing. RESPIRATORY: Has chronic cough, but no wheezing or hemoptysis. No painful respiration. CARDIOVASCULAR: No chest pain or orthopnea. No syncope. GASTROINTESTINAL: Positive for nausea and diarrhea. Occasional vomiting. No abdominal pain. GU: No dysuria or hematuria. No incontinence. ENDOCRINE: No polyuria or polydipsia. No heat or cold intolerance. HEMATOLOGIC: The patient denies anemia, easy bruising, or bleeding. LYMPHATIC: No swollen glands. MUSCULOSKELETAL: The patient denies pain in her neck, back, shoulders, knees, or hips. No gout. NEUROLOGIC: No numbness or migraines. Denies stroke or seizures. PSYCH: The patient denies anxiety, insomnia, or depression.   PHYSICAL EXAMINATION:  GENERAL: The patient is chronically ill-appearing but in no acute distress.   VITAL SIGNS: Vital signs are currently remarkable for a blood pressure of 85/44 with a heart rate of 90 and a respiratory rate of 26. She is afebrile.   HEENT: Normocephalic, atraumatic. Pupils equally round and reactive to light and accommodation. Extraocular movements are intact. Sclerae are anicteric. Conjunctivae are clear. Oropharynx is dry but clear.   NECK: Supple without jugular venous distention. No adenopathy or thyromegaly is noted.   LUNGS: Decreased breath sounds with basilar rhonchi. No wheezes or rales. No dullness. Respiratory effort is normal.   CARDIAC: Irregularly irregular rhythm. No significant rubs, murmurs, or gallops. PMI is nondisplaced. Chest wall is nontender.   ABDOMEN: Soft, nontender, with normoactive bowel sounds. No organomegaly or masses were appreciated. No hernias or bruits were noted.   EXTREMITIES: Without clubbing, cyanosis, or edema. Pulses were 2+ bilaterally.   SKIN:  Warm and dry without rash or lesions.   NEUROLOGIC: Cranial nerves II through XII grossly intact. Deep tendon reflexes were symmetric.  Motor and sensory exam is nonfocal.   PSYCH: The patient was alert and oriented to person, place, and time. She was cooperative and used good judgment.   LABORATORY, DIAGNOSTIC AND RADIOLOGICAL DATA: Urinalysis showed trace bacteria with 4 WBCs per high-power field. Total CK 53, with an MB of 4.7 and a troponin of 0.13. White count was 26.7 with a hemoglobin of 12.3. Glucose 124 with a BUN of 29 and a creatinine of 0.90 and a GFR of greater than 60. Digoxin level was 2.2. Lipase 158. Magnesium 2.1. EKG revealed sinus rhythm with PACs. Chest x-ray revealed fibrosis and atelectasis at the right lung base.   ASSESSMENT:  1. Nausea with anorexia.  2. Diarrhea.  3. Dehydration.  4. Digoxin toxicity.  5. Paroxysmal atrial fibrillation on Pradaxa.  6. Leukocytosis of unclear etiology.  7. Chronic obstructive pulmonary disease.  8. Atherosclerotic cardiovascular disease status post coronary artery bypass graft.   PLAN:  1. The patient will be admitted to telemetry and maintained on aspirin and Pradaxa.  2. We will hold her digoxin today and start a lower dose tomorrow.  3. We will check a digoxin level in the morning prior to dosing her.  4. We will hold her Lasix and potassium at this time.  5. Will also hold her enalapril as her blood pressure is low.  6. We will continue her Cardizem.  7. Begin IV fluids for volume resuscitation.  8. Send stool for C. difficile.  9. Begin empiric IV Flagyl.  10. We will send a urine culture.  11. Consult Dr. Lady GaryFath in regards to her atrial fibrillation and digoxin toxicity.  12. We will follow serial cardiac enzymes.  13. Follow up routine labs in the morning.  14. Chest x-ray in the morning.  15. Clear liquid diet for now.  16. Further treatment and evaluation will depend upon the patient's progress.   TOTAL TIME SPENT ON THIS PATIENT: 50 minutes.   ____________________________ Dawn LopeJeffrey D. Judithann SheenSparks, Dawn Mcclain jds:ap D: 05/31/2012 13:54:08 ET T: 05/31/2012  14:14:01 ET JOB#: [id number]  cc: Dawn LopeJeffrey D. Judithann SheenSparks, Dawn Mcclain, <Dictator> Jillene Bucksenny C. Arlana Pouchate, Dawn Mcclain Arayla Kruschke Rodena Medin Yardley Beltran Dawn Mcclain ELECTRONICALLY SIGNED 05/31/2012 15:38
